# Patient Record
Sex: Female | Born: 1980 | Race: Black or African American | Hispanic: No | Marital: Single | Smoking: Never smoker
Health system: Southern US, Community
[De-identification: ages and names within clinical notes are randomized; demographics above are authoritative.]

## PROBLEM LIST (undated history)

## (undated) DIAGNOSIS — R51 Headache: Secondary | ICD-10-CM

## (undated) DIAGNOSIS — K625 Hemorrhage of anus and rectum: Secondary | ICD-10-CM

## (undated) DIAGNOSIS — T7840XA Allergy, unspecified, initial encounter: Secondary | ICD-10-CM

## (undated) HISTORY — DX: Headache: R51

## (undated) HISTORY — DX: Hemorrhage of anus and rectum: K62.5

## (undated) HISTORY — DX: Allergy, unspecified, initial encounter: T78.40XA

## (undated) HISTORY — PX: COSMETIC SURGERY: SHX468

---

## 2003-11-28 ENCOUNTER — Emergency Department (HOSPITAL_COMMUNITY): Admission: EM | Admit: 2003-11-28 | Discharge: 2003-11-29 | Payer: Self-pay | Admitting: Emergency Medicine

## 2004-05-20 ENCOUNTER — Emergency Department (HOSPITAL_COMMUNITY): Admission: EM | Admit: 2004-05-20 | Discharge: 2004-05-20 | Payer: Self-pay | Admitting: Emergency Medicine

## 2006-08-20 ENCOUNTER — Emergency Department (HOSPITAL_COMMUNITY): Admission: EM | Admit: 2006-08-20 | Discharge: 2006-08-20 | Payer: Self-pay | Admitting: Emergency Medicine

## 2007-08-15 ENCOUNTER — Other Ambulatory Visit: Admission: RE | Admit: 2007-08-15 | Discharge: 2007-08-15 | Payer: Self-pay | Admitting: Gynecology

## 2007-11-10 ENCOUNTER — Emergency Department (HOSPITAL_COMMUNITY): Admission: EM | Admit: 2007-11-10 | Discharge: 2007-11-10 | Payer: Self-pay | Admitting: Emergency Medicine

## 2011-11-12 ENCOUNTER — Ambulatory Visit (INDEPENDENT_AMBULATORY_CARE_PROVIDER_SITE_OTHER): Payer: Managed Care, Other (non HMO) | Admitting: Physician Assistant

## 2011-11-12 VITALS — BP 114/76 | HR 76 | Temp 98.5°F | Resp 16 | Ht 63.0 in | Wt 188.0 lb

## 2011-11-12 DIAGNOSIS — J309 Allergic rhinitis, unspecified: Secondary | ICD-10-CM

## 2011-11-12 DIAGNOSIS — J019 Acute sinusitis, unspecified: Secondary | ICD-10-CM

## 2011-11-12 MED ORDER — IPRATROPIUM BROMIDE 0.03 % NA SOLN: 2.0000 | Freq: Two times a day (BID) | NASAL | Status: DC

## 2011-11-12 MED ORDER — AMOXICILLIN 875 MG PO TABS: 1750.0000 mg | ORAL_TABLET | Freq: Two times a day (BID) | ORAL | Status: AC

## 2011-11-12 NOTE — Patient Instructions (Signed)
Get lots of rest.  Drink at least 64 ounces of water daily. Continue to use the Mucinex to thin the mucous so that it can more easily drain out!

## 2011-11-12 NOTE — Progress Notes (Signed)
  Subjective:    Patient ID: Natasha Dunn, female    DOB: 26-Feb-1981, 31 y.o.   MRN: [id number]  HPI 1 week of congestion.  Loss of taste and smell.  WAS coughing a little bit. No ST, HA.  No myalgias, arthralgias.  No GU/GI symptoms.   Review of Systems As above.    Objective:   Physical Exam Vital signs noted. Well-developed, well nourished BF who is awake, alert and oriented, in NAD. HEENT: [place]/AT, PERRL, EOMI.  Sclera and conjunctiva are clear.  EAC are patent, TMs are normal in appearance. Nasal mucosa is pink and moist, congested. OP is clear. Tenderness with palpation of the frontal sinuses bilaterally. Neck: supple, non-tender, no lymphadenopathy, thyromegaly. Heart: RRR, no murmur Lungs: CTA Skin: warm and dry without rash.      Assessment & Plan:   1. AR (allergic rhinitis)  ipratropium (ATROVENT) 0.03 % nasal spray  2. Acute sinusitis, unspecified  amoxicillin (AMOXIL) 875 MG tablet   Supportive care. Continue Mucinex.

## 2011-12-07 ENCOUNTER — Ambulatory Visit (INDEPENDENT_AMBULATORY_CARE_PROVIDER_SITE_OTHER): Payer: Managed Care, Other (non HMO) | Admitting: Internal Medicine

## 2011-12-07 VITALS — BP 101/70 | HR 90 | Temp 98.1°F | Resp 16 | Ht 63.0 in | Wt 187.0 lb

## 2011-12-07 DIAGNOSIS — K625 Hemorrhage of anus and rectum: Secondary | ICD-10-CM

## 2011-12-07 DIAGNOSIS — K602 Anal fissure, unspecified: Secondary | ICD-10-CM

## 2011-12-07 DIAGNOSIS — K5900 Constipation, unspecified: Secondary | ICD-10-CM

## 2011-12-07 DIAGNOSIS — K6289 Other specified diseases of anus and rectum: Secondary | ICD-10-CM

## 2011-12-07 NOTE — Progress Notes (Signed)
  Subjective:    Patient ID: Natasha Dunn, female    DOB: [birth year], 31 y.o.   MRN: [id number]  HPI On her menses and refuses exam of her anal area today but agrees to rtc for exam if persists. Has constipation, and occ bleeds on TP after hard stool. Can be painful No fever, abd pain, or bloody diarrhea   Review of Systems     Objective:   Physical Exam Healthy  Refuses anal exam       Assessment & Plan:  Anal fissure likely Diltiazem and lidocain gel tid Constipation care

## 2011-12-07 NOTE — Patient Instructions (Signed)
Anal Fissure, Adult An anal fissure is a small tear or crack in the skin around the anus. Bleeding from a fissure usually stops on its own within a few minutes. However, bleeding will often reoccur with each bowel movement until the crack heals.  CAUSES   Passing large, hard stools.   Frequent diarrheal stools.   Constipation.   Inflammatory bowel disease (Crohn's disease or ulcerative colitis).   Infections.   Anal sex.  SYMPTOMS   Small amounts of blood seen on your stools, on toilet paper, or in the toilet after a bowel movement.   Rectal bleeding.   Painful bowel movements.   Itching or irritation around the anus.  DIAGNOSIS Your caregiver will examine the anal area. An anal fissure can usually be seen with careful inspection. A rectal exam may be performed and a short tube (anoscope) may be used to examine the anal canal. TREATMENT   You may be instructed to take fiber supplements. These supplements can soften your stool to help make bowel movements easier.   Sitz baths may be recommended to help heal the tear. Do not use soap in the sitz baths.   A medicated cream or ointment may be prescribed to lessen discomfort.  HOME CARE INSTRUCTIONS   Maintain a diet high in fruits, whole grains, and vegetables. Avoid constipating foods like bananas and dairy products.   Take sitz baths as directed by your caregiver.   Drink enough fluids to keep your urine clear or pale yellow.   Only take over-the-counter or prescription medicines for pain, discomfort, or fever as directed by your caregiver. Do not take aspirin as this may increase bleeding.   Do not use ointments containing numbing medications (anesthetics) or hydrocortisone. They could slow healing.  SEEK MEDICAL CARE IF:   Your fissure is not completely healed within 3 days.   You have further bleeding.   You have a fever.   You have diarrhea mixed with blood.   You have pain.   Your problem is getting worse  rather than better.  MAKE SURE YOU:   Understand these instructions.   Will watch your condition.   Will get help right away if you are not doing well or get worse.  Document Released: 07/10/2005 Document Revised: 06/29/2011 Document Reviewed: 12/25/2010 ExitCare Patient Information 2012 [employer]. 

## 2012-04-16 ENCOUNTER — Ambulatory Visit (INDEPENDENT_AMBULATORY_CARE_PROVIDER_SITE_OTHER): Payer: Managed Care, Other (non HMO) | Admitting: Emergency Medicine

## 2012-04-16 VITALS — BP 118/76 | HR 80 | Temp 98.1°F | Resp 16 | Ht 64.0 in | Wt 182.2 lb

## 2012-04-16 DIAGNOSIS — K625 Hemorrhage of anus and rectum: Secondary | ICD-10-CM

## 2012-04-16 DIAGNOSIS — K5900 Constipation, unspecified: Secondary | ICD-10-CM

## 2012-04-16 MED ORDER — POLYETHYLENE GLYCOL 3350 17 GM/SCOOP PO POWD: 17.0000 g | Freq: Two times a day (BID) | ORAL | Status: DC | PRN

## 2012-04-16 NOTE — Progress Notes (Deleted)
   Date:  04/16/2012   Name:  Natasha Dunn   DOB:  10-09-1980   MRN:  [id number] Gender: female Age: 31 y.o.  PCP:  No primary provider on file.    Chief Complaint: Hemorrhoids   History of Present Illness:  Natasha Dunn is a 31 y.o. pleasant patient who presents with the following:  ***  There is no problem list on file for this patient.   Past Medical History  Diagnosis Date  . Allergy     No past surgical history on file.  History  Substance Use Topics  . Smoking status: Never Smoker   . Smokeless tobacco: Not on file  . Alcohol Use: Yes    Family History  Problem Relation Age of Onset  . Hypertension Mother   . Diabetes Sister     No Known Allergies  Medication list has been reviewed and updated.  Outpatient Prescriptions Prior to Visit  Medication Sig Dispense Refill  . ipratropium (ATROVENT) 0.03 % nasal spray Place 2 sprays into the nose 2 (two) times daily.  30 mL  0    Review of Systems:  ***  Physical Examination: Filed Vitals:   04/16/12 1557  BP: 118/76  Pulse: 80  Temp: 98.1 F (36.7 C)  Resp: 16   Filed Vitals:   04/16/12 1557  Height: 5\' 4"  (1.626 m)  Weight: 182 lb 3.2 oz (82.645 kg)   Body mass index is 31.27 kg/(m^2). Ideal Body Weight: Weight in (lb) to have BMI = 25: 145.3   ***  Assessment and Plan: ***  Carmelina Dane, MD

## 2012-04-16 NOTE — Progress Notes (Signed)
   Date:  04/16/2012   Name:  Natasha Dunn   DOB:  1981-02-14   MRN:  [id number] Gender: female Age: 31 y.o.  PCP:  No primary provider on file.    Chief Complaint: Hemorrhoids   History of Present Illness:  Natasha Dunn is a 31 y.o. pleasant patient who presents with the following:  Gives a history of pain with defecation, frequent blood coating stools, and a palpable irreducible mass in anus.  Has experienced this problem for at least 9 months.  Has not been compliant with dietary suggestions.  Never evaluated.    There is no problem list on file for this patient.   Past Medical History  Diagnosis Date  . Allergy     No past surgical history on file.  History  Substance Use Topics  . Smoking status: Never Smoker   . Smokeless tobacco: Not on file  . Alcohol Use: Yes    Family History  Problem Relation Age of Onset  . Hypertension Mother   . Diabetes Sister     No Known Allergies  Medication list has been reviewed and updated.  Outpatient Prescriptions Prior to Visit  Medication Sig Dispense Refill  . ipratropium (ATROVENT) 0.03 % nasal spray Place 2 sprays into the nose 2 (two) times daily.  30 mL  0    Review of Systems:  As per HPI, otherwise negative.    Physical Examination: Filed Vitals:   04/16/12 1557  BP: 118/76  Pulse: 80  Temp: 98.1 F (36.7 C)  Resp: 16   Filed Vitals:   04/16/12 1557  Height: 5\' 4"  (1.626 m)  Weight: 182 lb 3.2 oz (82.645 kg)   Body mass index is 31.27 kg/(m^2). Ideal Body Weight: Weight in (lb) to have BMI = 25: 145.3    GEN: WDWN, NAD, Non-toxic, Alert & Oriented x 3 HEENT: Atraumatic, Normocephalic.  Ears and Nose: No external deformity. EXTR: No clubbing/cyanosis/edema NEURO: Normal gait.  PSYCH: Normally interactive. Conversant. Not depressed or anxious appearing.  Calm demeanor.  RECTAL:  No external mass or bleeding.   Assessment and Plan: Constipation Rectal bleeding GI consult for  colonoscopy miralax   Carmelina Dane, MD I have reviewed and agree with documentation. Robert P. Merla Riches, M.D.

## 2012-04-18 DIAGNOSIS — K625 Hemorrhage of anus and rectum: Secondary | ICD-10-CM

## 2012-04-18 HISTORY — DX: Hemorrhage of anus and rectum: K62.5

## 2012-05-07 ENCOUNTER — Encounter: Payer: Self-pay | Admitting: Family Medicine

## 2012-10-08 ENCOUNTER — Ambulatory Visit (INDEPENDENT_AMBULATORY_CARE_PROVIDER_SITE_OTHER): Payer: Managed Care, Other (non HMO) | Admitting: Emergency Medicine

## 2012-10-08 ENCOUNTER — Ambulatory Visit: Payer: Managed Care, Other (non HMO)

## 2012-10-08 VITALS — BP 133/84 | HR 75 | Temp 97.7°F | Resp 18 | Ht 63.0 in | Wt 183.0 lb

## 2012-10-08 DIAGNOSIS — R079 Chest pain, unspecified: Secondary | ICD-10-CM

## 2012-10-08 DIAGNOSIS — M940 Chondrocostal junction syndrome [Tietze]: Secondary | ICD-10-CM

## 2012-10-08 MED ORDER — NAPROXEN SODIUM 550 MG PO TABS: 550.0000 mg | ORAL_TABLET | Freq: Two times a day (BID) | ORAL | Status: DC

## 2012-10-08 NOTE — Patient Instructions (Addendum)
Costochondritis Costochondritis (Tietze syndrome), or costochondral separation, is a swelling and irritation (inflammation) of the tissue (cartilage) that connects your ribs with your breastbone (sternum). It may occur on its own (spontaneously), through damage caused by an accident (trauma), or simply from coughing or minor exercise. It may take up to 6 weeks to get better and longer if you are unable to be conservative in your activities. HOME CARE INSTRUCTIONS   Avoid exhausting physical activity. Try not to strain your ribs during normal activity. This would include any activities using chest, belly (abdominal), and side muscles, especially if heavy weights are used.  Use ice for 15 to 20 minutes per hour while awake for the first 2 days. Place the ice in a plastic bag, and place a towel between the bag of ice and your skin.  Only take over-the-counter or prescription medicines for pain, discomfort, or fever as directed by your caregiver. SEEK IMMEDIATE MEDICAL CARE IF:   Your pain increases or you are very uncomfortable.  You have a fever.  You develop difficulty with your breathing.  You cough up blood.  You develop worse chest pains, shortness of breath, sweating, or vomiting.  You develop new, unexplained problems (symptoms). MAKE SURE YOU:   Understand these instructions.  Will watch your condition.  Will get help right away if you are not doing well or get worse. Document Released: 04/19/2005 Document Revised: 10/02/2011 Document Reviewed: 02/26/2008 ExitCare Patient Information 2013 [employer].  

## 2012-10-08 NOTE — Progress Notes (Signed)
[facility] [address] [place] [address] [phone]  Date:  10/08/2012   Name:  Natasha Dunn   DOB:  June 12, 1981   MRN:  [id number]  PCP:  No primary provider on file.    Chief Complaint: Chest Pain   History of Present Illness:  Natasha Dunn is a 32 y.o. very pleasant female patient who presents with the following:  Has intermittent sharp left upper chest wall pain that started three days ago.  Non radiating. No history of trauma or overuse.  No cough or hemoptysis.  No shortness of breath or wheezing.  No nausea or breath.  Pain lasts 20 minutes at a time and goes away spontaneously.  No provocative factors.  Pain not radiating.  No risk factors for PE/DVT.  Non smoker.  No OCP.  Works two jobs.  Lost a cousin last week to sudden death.  No depression.  No improvement with over the counter medications or other home remedies. Denies other complaint or health concern today.   There is no problem list on file for this patient.   Past Medical History  Diagnosis Date  . Allergy   . Rectal bleeding 04/18/2012    colonoscopy scheduled for 05/01/12  . Headache     History reviewed. No pertinent past surgical history.  History  Substance Use Topics  . Smoking status: Never Smoker   . Smokeless tobacco: Not on file  . Alcohol Use: Yes     Comment: social    Family History  Problem Relation Age of Onset  . Hypertension Mother   . Diabetes Sister   . Hypertension Sister   . Cancer Maternal Grandmother   . Hypertension Maternal Grandmother   . Diabetes Maternal Grandfather   . Hypertension Maternal Grandfather     No Known Allergies  Medication list has been reviewed and updated.  Current Outpatient Prescriptions on File Prior to Visit  Medication Sig Dispense Refill  . ipratropium (ATROVENT) 0.03 % nasal spray Place 2 sprays into the nose 2 (two) times daily.  30 mL  0  . polyethylene glycol powder (GLYCOLAX/MIRALAX) powder Take 17 g by  mouth 2 (two) times daily as needed.  3350 g  1   No current facility-administered medications on file prior to visit.    Review of Systems:  As per HPI, otherwise negative.    Physical Examination: Filed Vitals:   10/08/12 1846  BP: 133/84  Pulse: 75  Temp: 97.7 F (36.5 C)  Resp: 18   Filed Vitals:   10/08/12 1846  Height: 5\' 3"  (1.6 m)  Weight: 183 lb (83.008 kg)   Body mass index is 32.43 kg/(m^2). Ideal Body Weight: Weight in (lb) to have BMI = 25: 140.8  GEN: WDWN, NAD, Non-toxic, A & O x 3 HEENT: Atraumatic, Normocephalic. Neck supple. No masses, No LAD. Ears and Nose: No external deformity. CV: RRR, No M/G/R. No JVD. No thrill. No extra heart sounds.  No chest wall tenderness. No crepitus or rub PULM: CTA B, no wheezes, crackles, rhonchi. No retractions. No resp. distress. No accessory muscle use. ABD: S, NT, ND, +BS. No rebound. No HSM. EXTR: No c/c/e NEURO Normal gait.  PSYCH: Normally interactive. Conversant. Not depressed or anxious appearing.  Calm demeanor.    Assessment and Plan: Chest pain CXR    Signed,  Phillips Odor, MD   [facility] reading (PRIMARY) by  Dr. Dareen Piano.  Negative chest.

## 2013-01-20 ENCOUNTER — Ambulatory Visit (INDEPENDENT_AMBULATORY_CARE_PROVIDER_SITE_OTHER): Payer: Managed Care, Other (non HMO) | Admitting: Emergency Medicine

## 2013-01-20 VITALS — BP 123/80 | HR 54 | Temp 98.3°F | Resp 18 | Ht 63.5 in | Wt 188.8 lb

## 2013-01-20 DIAGNOSIS — N644 Mastodynia: Secondary | ICD-10-CM

## 2013-01-20 NOTE — Progress Notes (Signed)
[facility] [address], [place] [address] [phone]  Date:  01/20/2013   Name:  Natasha Dunn   DOB:  09/20/1980   MRN:  [id number]  PCP:  No primary provider on file.    Chief Complaint: Breast Pain   History of Present Illness:  Natasha Dunn is a 32 y.o. very pleasant female patient who presents with the following:  Seen in March with costochondritis.  Her aunt was just diagnosed with breast cancer.  She has noticed pain in her left breast in the absence of masses, drainage, bleeding, or discharge.  She is concerned that she may have a breast cancer.  She has never had a mammogram.  No improvement with over the counter medications or other home remedies. Denies other complaint or health concern today.   There are no active problems to display for this patient.   Past Medical History  Diagnosis Date  . Allergy   . Rectal bleeding 04/18/2012    colonoscopy scheduled for 05/01/12  . GEXBMWUX(324.4)     Past Surgical History  Procedure Laterality Date  . Cosmetic surgery      History  Substance Use Topics  . Smoking status: Never Smoker   . Smokeless tobacco: Not on file  . Alcohol Use: Yes     Comment: social    Family History  Problem Relation Age of Onset  . Hypertension Mother   . Diabetes Sister   . Hypertension Sister   . Cancer Maternal Grandmother   . Hypertension Maternal Grandmother   . Diabetes Maternal Grandfather   . Hypertension Maternal Grandfather     No Known Allergies  Medication list has been reviewed and updated.  Current Outpatient Prescriptions on File Prior to Visit  Medication Sig Dispense Refill  . ipratropium (ATROVENT) 0.03 % nasal spray Place 2 sprays into the nose 2 (two) times daily.  30 mL  0  . naproxen sodium (ANAPROX DS) 550 MG tablet Take 1 tablet (550 mg total) by mouth 2 (two) times daily with a meal.  40 tablet  0  . polyethylene glycol powder (GLYCOLAX/MIRALAX) powder Take 17 g by mouth 2  (two) times daily as needed.  3350 g  1   No current facility-administered medications on file prior to visit.    Review of Systems:  As per HPI, otherwise negative.    Physical Examination: Filed Vitals:   01/20/13 1543  BP: 123/80  Pulse: 54  Temp: 98.3 F (36.8 C)  Resp: 18   Filed Vitals:   01/20/13 1543  Height: 5' 3.5" (1.613 m)  Weight: 188 lb 12.8 oz (85.639 kg)   Body mass index is 32.92 kg/(m^2). Ideal Body Weight: Weight in (lb) to have BMI = 25: 143.1  GEN: WDWN, NAD, Non-toxic, A & O x 3 HEENT: Atraumatic, Normocephalic. Neck supple. No masses, No LAD. Ears and Nose: No external deformity. CV: RRR, No M/G/R. No JVD. No thrill. No extra heart sounds. PULM: CTA B, no wheezes, crackles, rhonchi. No retractions. No resp. distress. No accessory muscle use. ABD: S, NT, ND, +BS. No rebound. No HSM. EXTR: No c/c/e NEURO Normal gait.  PSYCH: Normally interactive. Conversant. Not depressed or anxious appearing.  Calm demeanor.  BREASTS:  Normal bilaterally no masses  Assessment and Plan: Breast pain Mammogram   Signed,  Phillips Odor, MD

## 2013-01-20 NOTE — Patient Instructions (Addendum)

## 2013-01-22 NOTE — Addendum Note (Signed)
Addended by: Carmelina Dane on: 01/22/2013 02:26 PM   Modules accepted: Orders

## 2013-06-29 ENCOUNTER — Ambulatory Visit (INDEPENDENT_AMBULATORY_CARE_PROVIDER_SITE_OTHER): Payer: BC Managed Care – PPO | Admitting: Family Medicine

## 2013-06-29 VITALS — BP 122/78 | HR 80 | Temp 98.1°F | Resp 16 | Ht 63.25 in | Wt 199.0 lb

## 2013-06-29 DIAGNOSIS — H00016 Hordeolum externum left eye, unspecified eyelid: Secondary | ICD-10-CM

## 2013-06-29 DIAGNOSIS — H00019 Hordeolum externum unspecified eye, unspecified eyelid: Secondary | ICD-10-CM

## 2013-06-29 MED ORDER — TOBRAMYCIN 0.3 % OP SOLN: 1.0000 [drp] | Freq: Four times a day (QID) | OPHTHALMIC | Status: AC

## 2013-06-29 MED ORDER — DOXYCYCLINE HYCLATE 100 MG PO TABS: 100.0000 mg | ORAL_TABLET | Freq: Two times a day (BID) | ORAL | Status: DC

## 2013-06-29 NOTE — Progress Notes (Addendum)
° °  Subjective:  This chart was scribed for Elvina Sidle, MD by Carl Best, Medical Scribe. This patient was seen in Room 3 and the patient's care was started at 11:34 AM.   Patient ID: Natasha Dunn, female    DOB: [birth year], 32 y.o.   MRN: [id number]  HPI HPI Comments: Natasha Dunn is a 32 y.o. female who [facility] the [facility] complaining of upper left eyelid swelling that started two days ago.  The patient states that when she woke up this morning her left eyelid was crusty.  She lists left eyelid pain as an associated symptom.  The patient states that she applied a warm compress to her left eye.  The patient works in Clinical biochemist for [employer].  Past Medical History  Diagnosis Date   Allergy    Rectal bleeding 04/18/2012    colonoscopy scheduled for 05/01/12   Headache(784.0)    Past Surgical History  Procedure Laterality Date   Cosmetic surgery     Family History  Problem Relation Age of Onset   Hypertension Mother    Diabetes Sister    Hypertension Sister    Cancer Maternal Grandmother    Hypertension Maternal Grandmother    Diabetes Maternal Grandfather    Hypertension Maternal Grandfather    History   Social History   Marital Status: Single    Spouse Name: N/A    Number of Children: N/A   Years of Education: N/A   Occupational History   Not on file.   Social History Main Topics   Smoking status: Never Smoker    Smokeless tobacco: Not on file   Alcohol Use: Yes     Comment: social   Drug Use: No   Sexual Activity: Not on file   Other Topics Concern   Not on file   Social History Narrative   No narrative on file   No Known Allergies   Review of Systems  Eyes: Positive for pain (left eye).  All other systems reviewed and are negative.     Objective:  Physical Exam No acute distress Left eyes shows mild swelling in the mid upper lid with erythema Ophthalmoscopic exam is  normal     Assessment & Plan:   I personally performed the services described in this documentation, which was scribed in my presence. The recorded information has been reviewed and is accurate.  Stye, left - Plan: tobramycin (TOBREX) 0.3 % ophthalmic solution, doxycycline (VIBRA-TABS) 100 MG tablet Hot compresses, temporarily d/c eye liner Signed, Elvina Sidle, MD

## 2013-09-22 ENCOUNTER — Ambulatory Visit (INDEPENDENT_AMBULATORY_CARE_PROVIDER_SITE_OTHER): Payer: BC Managed Care – PPO | Admitting: Physician Assistant

## 2013-09-22 VITALS — BP 120/86 | HR 84 | Temp 98.2°F | Resp 18 | Ht 63.0 in | Wt 183.8 lb

## 2013-09-22 DIAGNOSIS — H6980 Other specified disorders of Eustachian tube, unspecified ear: Secondary | ICD-10-CM

## 2013-09-22 DIAGNOSIS — H6590 Unspecified nonsuppurative otitis media, unspecified ear: Secondary | ICD-10-CM

## 2013-09-22 MED ORDER — FLUTICASONE PROPIONATE 50 MCG/ACT NA SUSP: 2.0000 | Freq: Every day | NASAL | Status: AC

## 2013-09-22 NOTE — Patient Instructions (Signed)
There is no evidence of infection in your ear today.  Use the Flonase twice daily for 1 week, then decrease to once daily  Use ibuprofen 600mg  every 8 hours with food if needed for pain  Please let us know if any symptoms are worsening or not improving

## 2013-09-22 NOTE — Progress Notes (Signed)
   Subjective:    Patient ID: Natasha Dunn, female    DOB: 09/04/1980, 33 y.o.   MRN: [id number]  HPI   Natasha Dunn is a very pleasant 33 yr old female here with concern for possible ear infection.  The Left ear began hurting today.  She is able to hear out of the ear.  She denies drainage from the ear.  No known FB to the ear.  She denies URI symptoms.  Right ear hurts slightly, but left is worse.  No fever.  No sick contacts.  Frequent ear infections as a child.  Has not tried anything for symptoms   Review of Systems  Constitutional: Negative for fever and chills.  HENT: Positive for ear pain. Negative for congestion, ear discharge, rhinorrhea, sneezing and sore throat.   Respiratory: Negative for cough, shortness of breath and wheezing.   Cardiovascular: Negative.   Gastrointestinal: Negative.   Musculoskeletal: Negative.   Skin: Negative.        Objective:   Physical Exam  Vitals reviewed. Constitutional: She is oriented to person, place, and time. She appears well-developed and well-nourished. No distress.  HENT:  Head: Normocephalic and atraumatic.  Right Ear: Tympanic membrane and ear canal normal.  Left Ear: Ear canal normal. A middle ear effusion is present.  Mouth/Throat: Uvula is midline, oropharynx is clear and moist and mucous membranes are normal.  Eyes: Conjunctivae are normal. No scleral icterus.  Neck: Neck supple.  Cardiovascular: Normal rate, regular rhythm and normal heart sounds.   Pulmonary/Chest: Effort normal and breath sounds normal. She has no wheezes. She has no rales.  Neurological: She is alert and oriented to person, place, and time.  Skin: Skin is warm and dry.  Psychiatric: She has a normal mood and affect. Her behavior is normal.        Assessment & Plan:  Middle ear effusion - Plan: fluticasone (FLONASE) 50 MCG/ACT nasal spray  ETD (eustachian tube dysfunction) - Plan: fluticasone (FLONASE) 50 MCG/ACT nasal spray   Natasha Dunn is a very  pleasant 33 yr old female here with 1 day of Left ear pain.  On exam, there is no evidence of infection, but there is a mid ear effusion on the left.  Will treat with Flonase.  I placed Auralgan drops in her ear prior to leaving to help with pain relief.  Can use Advil, Tylenol if needed.   Pt to call or RTC if worsening or not improving  E. Frances FurbishElizabeth Anikah Hogge MHS, PA-C [facility] 3/3/20155:08 PM

## 2013-12-10 ENCOUNTER — Other Ambulatory Visit (HOSPITAL_COMMUNITY)
Admission: RE | Admit: 2013-12-10 | Discharge: 2013-12-10 | Disposition: A | Discharge status: Home / Self Care | Payer: BC Managed Care – PPO | Source: Ambulatory Visit | Attending: Family Medicine | Admitting: Family Medicine

## 2013-12-10 ENCOUNTER — Other Ambulatory Visit: Payer: Self-pay | Admitting: Family Medicine

## 2013-12-10 DIAGNOSIS — Z124 Encounter for screening for malignant neoplasm of cervix: Secondary | ICD-10-CM | POA: Insufficient documentation

## 2013-12-10 DIAGNOSIS — Z1151 Encounter for screening for human papillomavirus (HPV): Secondary | ICD-10-CM | POA: Insufficient documentation

## 2016-05-03 DIAGNOSIS — Z0000 Encounter for general adult medical examination without abnormal findings: Secondary | ICD-10-CM | POA: Diagnosis not present

## 2016-05-03 DIAGNOSIS — E669 Obesity, unspecified: Secondary | ICD-10-CM | POA: Diagnosis not present

## 2016-05-03 DIAGNOSIS — Z113 Encounter for screening for infections with a predominantly sexual mode of transmission: Secondary | ICD-10-CM | POA: Diagnosis not present

## 2016-08-22 DIAGNOSIS — R102 Pelvic and perineal pain: Secondary | ICD-10-CM | POA: Diagnosis not present

## 2016-08-22 DIAGNOSIS — R1033 Periumbilical pain: Secondary | ICD-10-CM | POA: Diagnosis not present

## 2016-08-25 ENCOUNTER — Other Ambulatory Visit: Payer: Self-pay | Admitting: Family Medicine

## 2016-08-25 DIAGNOSIS — R1033 Periumbilical pain: Secondary | ICD-10-CM

## 2016-08-25 DIAGNOSIS — R102 Pelvic and perineal pain: Secondary | ICD-10-CM

## 2016-09-01 DIAGNOSIS — Z713 Dietary counseling and surveillance: Secondary | ICD-10-CM | POA: Diagnosis not present

## 2016-11-09 DIAGNOSIS — J069 Acute upper respiratory infection, unspecified: Secondary | ICD-10-CM | POA: Diagnosis not present

## 2016-12-02 DIAGNOSIS — N390 Urinary tract infection, site not specified: Secondary | ICD-10-CM | POA: Diagnosis not present

## 2016-12-12 DIAGNOSIS — R350 Frequency of micturition: Secondary | ICD-10-CM | POA: Diagnosis not present

## 2017-04-24 DIAGNOSIS — M2142 Flat foot [pes planus] (acquired), left foot: Secondary | ICD-10-CM | POA: Diagnosis not present

## 2017-04-24 DIAGNOSIS — M2141 Flat foot [pes planus] (acquired), right foot: Secondary | ICD-10-CM | POA: Diagnosis not present

## 2017-04-24 DIAGNOSIS — M76822 Posterior tibial tendinitis, left leg: Secondary | ICD-10-CM | POA: Diagnosis not present

## 2017-05-08 ENCOUNTER — Other Ambulatory Visit (HOSPITAL_COMMUNITY)
Admission: RE | Admit: 2017-05-08 | Discharge: 2017-05-08 | Disposition: A | Discharge status: Home / Self Care | Payer: BLUE CROSS/BLUE SHIELD | Source: Ambulatory Visit | Attending: Family Medicine | Admitting: Family Medicine

## 2017-05-08 ENCOUNTER — Other Ambulatory Visit: Payer: Self-pay | Admitting: Family Medicine

## 2017-05-08 DIAGNOSIS — Z0000 Encounter for general adult medical examination without abnormal findings: Secondary | ICD-10-CM | POA: Diagnosis not present

## 2017-05-08 DIAGNOSIS — Z124 Encounter for screening for malignant neoplasm of cervix: Secondary | ICD-10-CM | POA: Diagnosis not present

## 2017-05-08 DIAGNOSIS — Z01411 Encounter for gynecological examination (general) (routine) with abnormal findings: Secondary | ICD-10-CM | POA: Diagnosis not present

## 2017-05-08 DIAGNOSIS — Z6832 Body mass index (BMI) 32.0-32.9, adult: Secondary | ICD-10-CM | POA: Diagnosis not present

## 2017-05-10 LAB — CYTOLOGY - PAP
Adequacy: ABSENT
Diagnosis: NEGATIVE
HPV: NOT DETECTED

## 2017-10-31 DIAGNOSIS — J3089 Other allergic rhinitis: Secondary | ICD-10-CM | POA: Diagnosis not present

## 2018-01-28 DIAGNOSIS — J069 Acute upper respiratory infection, unspecified: Secondary | ICD-10-CM | POA: Diagnosis not present

## 2018-02-06 DIAGNOSIS — H1030 Unspecified acute conjunctivitis, unspecified eye: Secondary | ICD-10-CM | POA: Diagnosis not present

## 2018-03-26 DIAGNOSIS — S99919A Unspecified injury of unspecified ankle, initial encounter: Secondary | ICD-10-CM | POA: Diagnosis not present

## 2018-03-27 ENCOUNTER — Ambulatory Visit
Admission: RE | Admit: 2018-03-27 | Discharge: 2018-03-27 | Disposition: A | Discharge status: Home / Self Care | Payer: BLUE CROSS/BLUE SHIELD | Source: Ambulatory Visit | Attending: Family Medicine | Admitting: Family Medicine

## 2018-03-27 ENCOUNTER — Other Ambulatory Visit: Payer: Self-pay | Admitting: Family Medicine

## 2018-03-27 DIAGNOSIS — S99911A Unspecified injury of right ankle, initial encounter: Secondary | ICD-10-CM

## 2018-05-01 DIAGNOSIS — L821 Other seborrheic keratosis: Secondary | ICD-10-CM | POA: Diagnosis not present

## 2018-05-01 DIAGNOSIS — L219 Seborrheic dermatitis, unspecified: Secondary | ICD-10-CM | POA: Diagnosis not present

## 2018-05-29 DIAGNOSIS — Z113 Encounter for screening for infections with a predominantly sexual mode of transmission: Secondary | ICD-10-CM | POA: Diagnosis not present

## 2018-05-29 DIAGNOSIS — Z6833 Body mass index (BMI) 33.0-33.9, adult: Secondary | ICD-10-CM | POA: Diagnosis not present

## 2018-05-29 DIAGNOSIS — Z0000 Encounter for general adult medical examination without abnormal findings: Secondary | ICD-10-CM | POA: Diagnosis not present

## 2018-05-29 DIAGNOSIS — R7303 Prediabetes: Secondary | ICD-10-CM | POA: Diagnosis not present

## 2018-08-28 DIAGNOSIS — R3989 Other symptoms and signs involving the genitourinary system: Secondary | ICD-10-CM | POA: Diagnosis not present

## 2018-08-28 DIAGNOSIS — N3090 Cystitis, unspecified without hematuria: Secondary | ICD-10-CM | POA: Diagnosis not present

## 2018-09-04 DIAGNOSIS — L219 Seborrheic dermatitis, unspecified: Secondary | ICD-10-CM | POA: Diagnosis not present

## 2018-10-17 DIAGNOSIS — F330 Major depressive disorder, recurrent, mild: Secondary | ICD-10-CM | POA: Diagnosis not present

## 2018-10-28 DIAGNOSIS — F330 Major depressive disorder, recurrent, mild: Secondary | ICD-10-CM | POA: Diagnosis not present

## 2018-11-13 DIAGNOSIS — F330 Major depressive disorder, recurrent, mild: Secondary | ICD-10-CM | POA: Diagnosis not present

## 2019-01-29 IMAGING — CR DG ANKLE COMPLETE 3+V*R*
3 series · 3 of 3 positions shown · non-contrast
Comparison: None.

CLINICAL DATA: Right ankle injury.

EXAM:
RIGHT ANKLE - COMPLETE 3+ VIEW

[x ankle ap right]
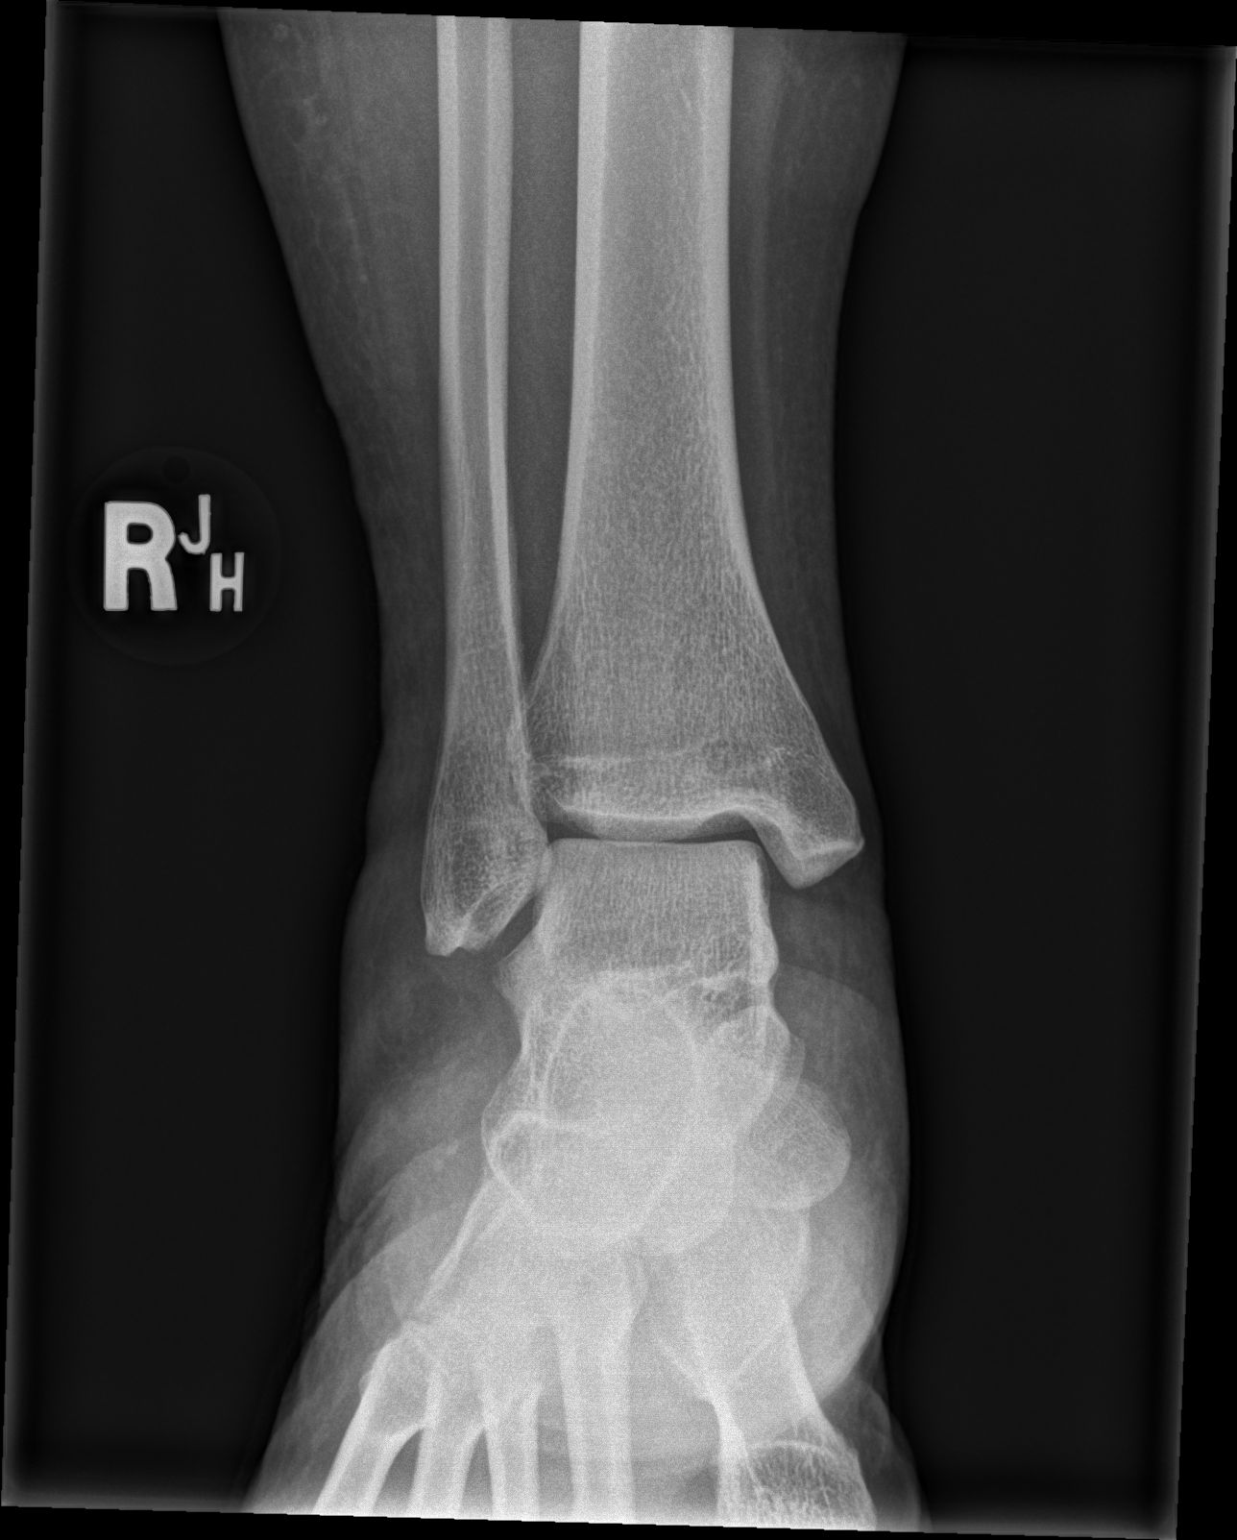

[x ankle obl right]
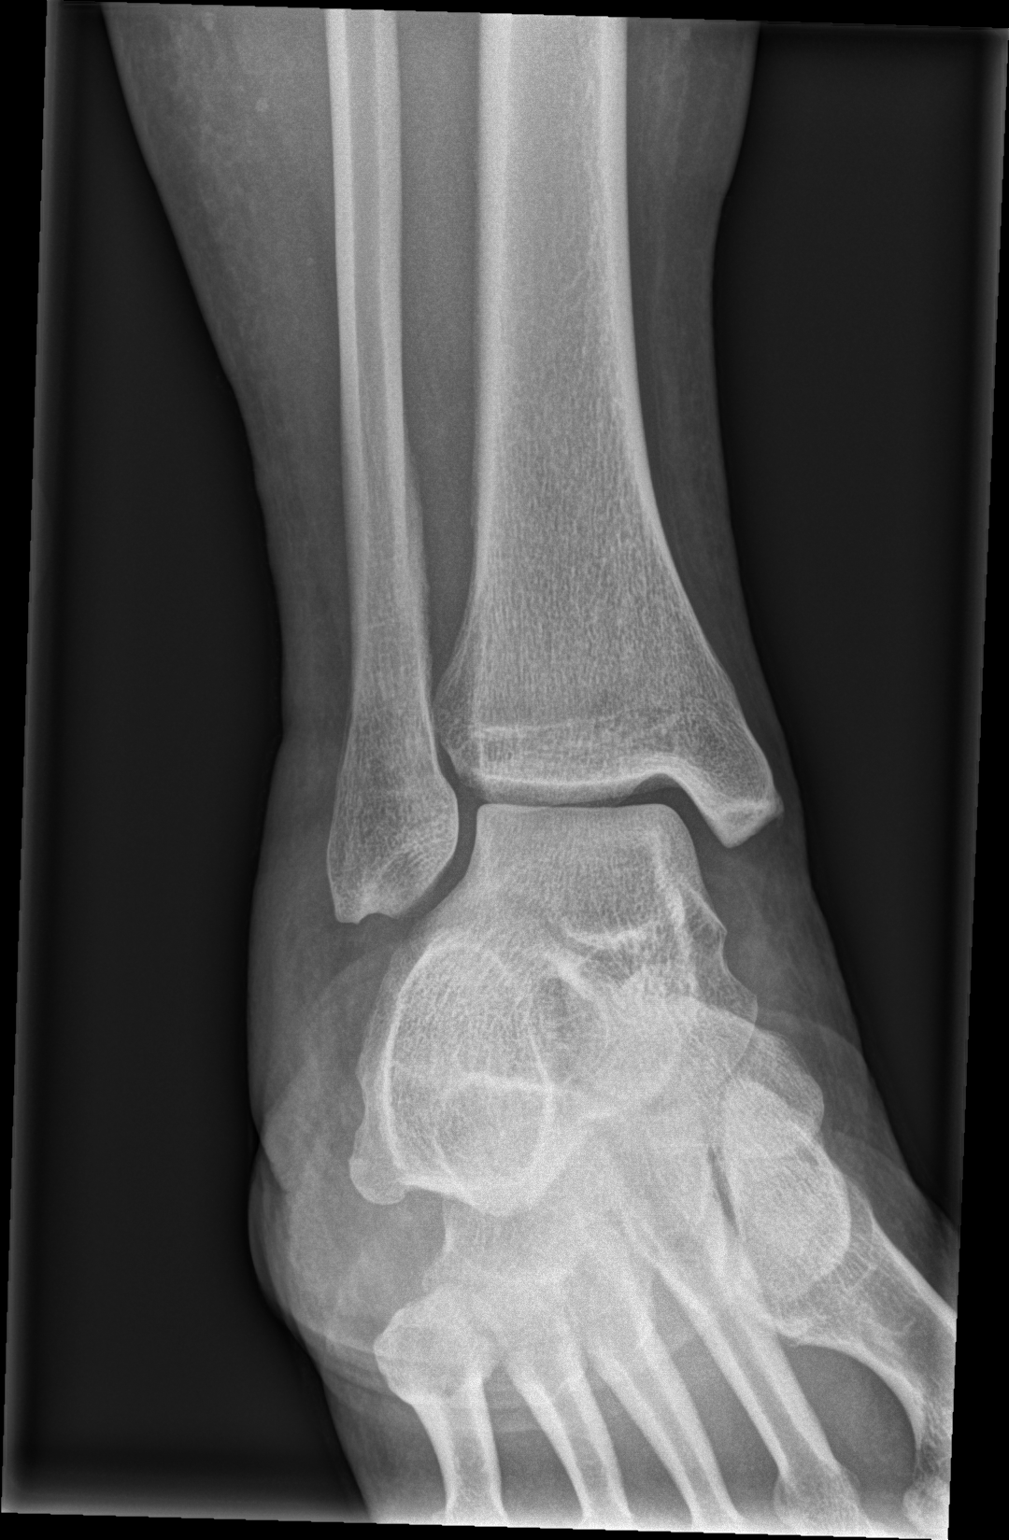

[x ankle lat right]
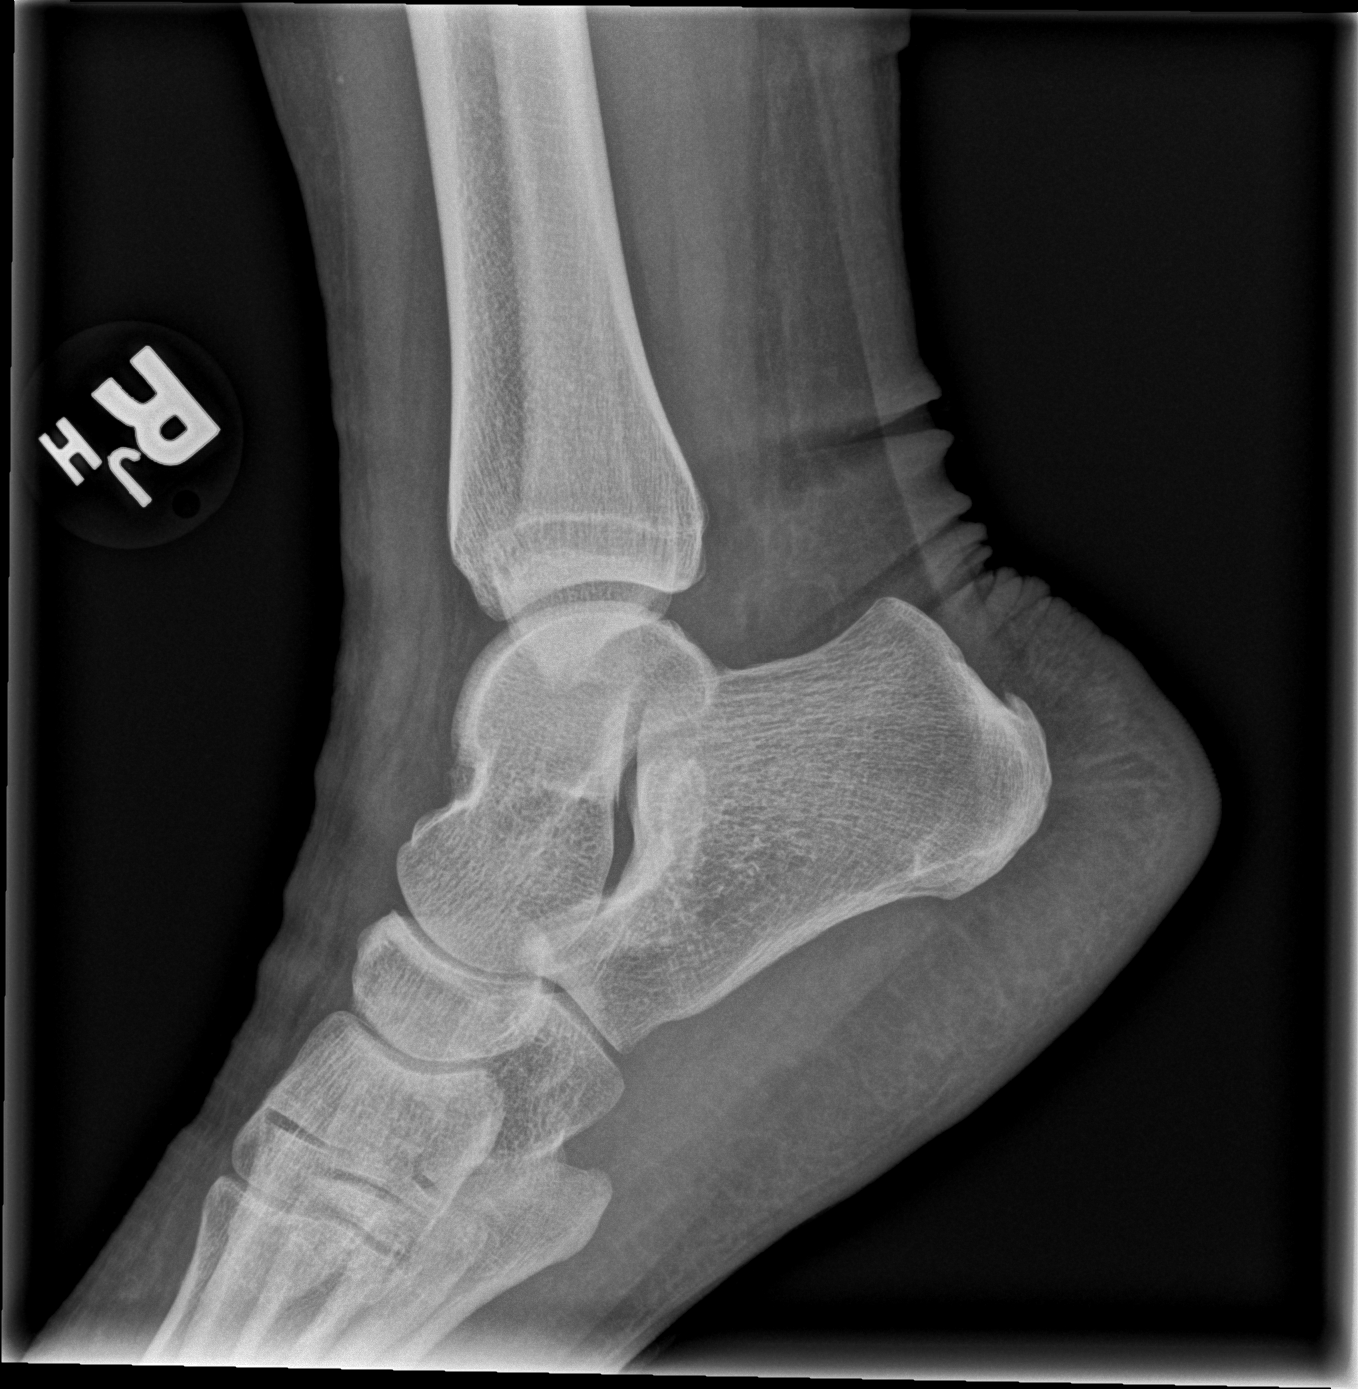

[3 of 3 positions shown; findings below may reference images not displayed]

FINDINGS: There is no evidence of fracture, dislocation, or joint effusion.
There is no evidence of arthropathy or other focal bone abnormality.
Soft tissues are unremarkable.
IMPRESSION: No acute osseous injury of the right ankle.

## 2019-03-05 DIAGNOSIS — L219 Seborrheic dermatitis, unspecified: Secondary | ICD-10-CM | POA: Diagnosis not present

## 2019-03-06 DIAGNOSIS — R350 Frequency of micturition: Secondary | ICD-10-CM | POA: Diagnosis not present

## 2019-03-07 DIAGNOSIS — R350 Frequency of micturition: Secondary | ICD-10-CM | POA: Diagnosis not present

## 2019-03-11 DIAGNOSIS — R350 Frequency of micturition: Secondary | ICD-10-CM | POA: Diagnosis not present

## 2019-03-12 DIAGNOSIS — R350 Frequency of micturition: Secondary | ICD-10-CM | POA: Diagnosis not present

## 2019-03-20 DIAGNOSIS — N3281 Overactive bladder: Secondary | ICD-10-CM | POA: Diagnosis not present

## 2019-03-20 DIAGNOSIS — R350 Frequency of micturition: Secondary | ICD-10-CM | POA: Diagnosis not present

## 2019-04-03 DIAGNOSIS — N3281 Overactive bladder: Secondary | ICD-10-CM | POA: Diagnosis not present

## 2019-04-03 DIAGNOSIS — R350 Frequency of micturition: Secondary | ICD-10-CM | POA: Diagnosis not present

## 2019-05-07 DIAGNOSIS — N3281 Overactive bladder: Secondary | ICD-10-CM | POA: Diagnosis not present

## 2019-05-07 DIAGNOSIS — R350 Frequency of micturition: Secondary | ICD-10-CM | POA: Diagnosis not present

## 2019-06-17 DIAGNOSIS — R350 Frequency of micturition: Secondary | ICD-10-CM | POA: Diagnosis not present

## 2019-06-17 DIAGNOSIS — N3281 Overactive bladder: Secondary | ICD-10-CM | POA: Diagnosis not present

## 2019-06-24 DIAGNOSIS — R350 Frequency of micturition: Secondary | ICD-10-CM | POA: Diagnosis not present

## 2019-07-01 DIAGNOSIS — R350 Frequency of micturition: Secondary | ICD-10-CM | POA: Diagnosis not present

## 2019-07-08 DIAGNOSIS — R350 Frequency of micturition: Secondary | ICD-10-CM | POA: Diagnosis not present

## 2019-08-23 DIAGNOSIS — R519 Headache, unspecified: Secondary | ICD-10-CM | POA: Diagnosis not present

## 2019-08-24 DIAGNOSIS — Z20828 Contact with and (suspected) exposure to other viral communicable diseases: Secondary | ICD-10-CM | POA: Diagnosis not present

## 2019-09-03 DIAGNOSIS — G43009 Migraine without aura, not intractable, without status migrainosus: Secondary | ICD-10-CM | POA: Diagnosis not present

## 2019-09-10 DIAGNOSIS — L219 Seborrheic dermatitis, unspecified: Secondary | ICD-10-CM | POA: Diagnosis not present

## 2019-11-03 DIAGNOSIS — N926 Irregular menstruation, unspecified: Secondary | ICD-10-CM | POA: Diagnosis not present

## 2019-11-03 DIAGNOSIS — J301 Allergic rhinitis due to pollen: Secondary | ICD-10-CM | POA: Diagnosis not present

## 2019-11-04 DIAGNOSIS — O26859 Spotting complicating pregnancy, unspecified trimester: Secondary | ICD-10-CM | POA: Diagnosis not present

## 2019-11-04 DIAGNOSIS — S161XXA Strain of muscle, fascia and tendon at neck level, initial encounter: Secondary | ICD-10-CM | POA: Diagnosis not present

## 2019-11-04 DIAGNOSIS — S39012A Strain of muscle, fascia and tendon of lower back, initial encounter: Secondary | ICD-10-CM | POA: Diagnosis not present

## 2019-11-06 DIAGNOSIS — O26859 Spotting complicating pregnancy, unspecified trimester: Secondary | ICD-10-CM | POA: Diagnosis not present

## 2019-11-06 DIAGNOSIS — O361910 Maternal care for other isoimmunization, first trimester, not applicable or unspecified: Secondary | ICD-10-CM | POA: Diagnosis not present

## 2019-11-06 DIAGNOSIS — O26851 Spotting complicating pregnancy, first trimester: Secondary | ICD-10-CM | POA: Diagnosis not present

## 2019-11-06 DIAGNOSIS — O9A219 Injury, poisoning and certain other consequences of external causes complicating pregnancy, unspecified trimester: Secondary | ICD-10-CM | POA: Diagnosis not present

## 2019-11-06 DIAGNOSIS — Z3A01 Less than 8 weeks gestation of pregnancy: Secondary | ICD-10-CM | POA: Diagnosis not present

## 2019-11-10 DIAGNOSIS — O26851 Spotting complicating pregnancy, first trimester: Secondary | ICD-10-CM | POA: Diagnosis not present

## 2019-11-28 DIAGNOSIS — Z3689 Encounter for other specified antenatal screening: Secondary | ICD-10-CM | POA: Diagnosis not present

## 2019-11-28 DIAGNOSIS — Z3A08 8 weeks gestation of pregnancy: Secondary | ICD-10-CM | POA: Diagnosis not present

## 2019-11-28 DIAGNOSIS — O361910 Maternal care for other isoimmunization, first trimester, not applicable or unspecified: Secondary | ICD-10-CM | POA: Diagnosis not present

## 2019-11-28 DIAGNOSIS — O09521 Supervision of elderly multigravida, first trimester: Secondary | ICD-10-CM | POA: Diagnosis not present

## 2019-11-28 DIAGNOSIS — O3680X0 Pregnancy with inconclusive fetal viability, not applicable or unspecified: Secondary | ICD-10-CM | POA: Diagnosis not present

## 2019-12-25 DIAGNOSIS — Z3689 Encounter for other specified antenatal screening: Secondary | ICD-10-CM | POA: Diagnosis not present

## 2019-12-25 DIAGNOSIS — O361910 Maternal care for other isoimmunization, first trimester, not applicable or unspecified: Secondary | ICD-10-CM | POA: Diagnosis not present

## 2019-12-25 DIAGNOSIS — Z3682 Encounter for antenatal screening for nuchal translucency: Secondary | ICD-10-CM | POA: Diagnosis not present

## 2019-12-25 DIAGNOSIS — Z3A12 12 weeks gestation of pregnancy: Secondary | ICD-10-CM | POA: Diagnosis not present

## 2019-12-25 DIAGNOSIS — O99211 Obesity complicating pregnancy, first trimester: Secondary | ICD-10-CM | POA: Diagnosis not present

## 2019-12-25 DIAGNOSIS — O09511 Supervision of elderly primigravida, first trimester: Secondary | ICD-10-CM | POA: Diagnosis not present

## 2020-01-05 DIAGNOSIS — O09512 Supervision of elderly primigravida, second trimester: Secondary | ICD-10-CM | POA: Diagnosis not present

## 2020-01-05 DIAGNOSIS — O361920 Maternal care for other isoimmunization, second trimester, not applicable or unspecified: Secondary | ICD-10-CM | POA: Diagnosis not present

## 2020-01-05 DIAGNOSIS — O99211 Obesity complicating pregnancy, first trimester: Secondary | ICD-10-CM | POA: Diagnosis not present

## 2020-01-05 DIAGNOSIS — O99212 Obesity complicating pregnancy, second trimester: Secondary | ICD-10-CM | POA: Diagnosis not present

## 2020-01-05 DIAGNOSIS — Z3A14 14 weeks gestation of pregnancy: Secondary | ICD-10-CM | POA: Diagnosis not present

## 2020-01-05 DIAGNOSIS — O361910 Maternal care for other isoimmunization, first trimester, not applicable or unspecified: Secondary | ICD-10-CM | POA: Diagnosis not present

## 2020-01-05 DIAGNOSIS — Z3A13 13 weeks gestation of pregnancy: Secondary | ICD-10-CM | POA: Diagnosis not present

## 2020-01-05 DIAGNOSIS — E669 Obesity, unspecified: Secondary | ICD-10-CM | POA: Diagnosis not present

## 2020-01-05 DIAGNOSIS — O09891 Supervision of other high risk pregnancies, first trimester: Secondary | ICD-10-CM | POA: Diagnosis not present

## 2020-01-05 DIAGNOSIS — O09521 Supervision of elderly multigravida, first trimester: Secondary | ICD-10-CM | POA: Diagnosis not present

## 2020-01-22 DIAGNOSIS — Z3689 Encounter for other specified antenatal screening: Secondary | ICD-10-CM | POA: Diagnosis not present

## 2020-02-19 DIAGNOSIS — Z3689 Encounter for other specified antenatal screening: Secondary | ICD-10-CM | POA: Diagnosis not present

## 2020-02-19 DIAGNOSIS — O09512 Supervision of elderly primigravida, second trimester: Secondary | ICD-10-CM | POA: Diagnosis not present

## 2020-02-19 DIAGNOSIS — O361920 Maternal care for other isoimmunization, second trimester, not applicable or unspecified: Secondary | ICD-10-CM | POA: Diagnosis not present

## 2020-02-19 DIAGNOSIS — Z3A20 20 weeks gestation of pregnancy: Secondary | ICD-10-CM | POA: Diagnosis not present

## 2020-02-19 DIAGNOSIS — Z363 Encounter for antenatal screening for malformations: Secondary | ICD-10-CM | POA: Diagnosis not present

## 2020-03-18 DIAGNOSIS — O09512 Supervision of elderly primigravida, second trimester: Secondary | ICD-10-CM | POA: Diagnosis not present

## 2020-03-18 DIAGNOSIS — Z362 Encounter for other antenatal screening follow-up: Secondary | ICD-10-CM | POA: Diagnosis not present

## 2020-03-18 DIAGNOSIS — O361920 Maternal care for other isoimmunization, second trimester, not applicable or unspecified: Secondary | ICD-10-CM | POA: Diagnosis not present

## 2020-03-18 DIAGNOSIS — Z3A24 24 weeks gestation of pregnancy: Secondary | ICD-10-CM | POA: Diagnosis not present

## 2020-04-15 DIAGNOSIS — O09513 Supervision of elderly primigravida, third trimester: Secondary | ICD-10-CM | POA: Diagnosis not present

## 2020-04-15 DIAGNOSIS — O361930 Maternal care for other isoimmunization, third trimester, not applicable or unspecified: Secondary | ICD-10-CM | POA: Diagnosis not present

## 2020-04-15 DIAGNOSIS — Z3A28 28 weeks gestation of pregnancy: Secondary | ICD-10-CM | POA: Diagnosis not present

## 2020-04-15 DIAGNOSIS — O99213 Obesity complicating pregnancy, third trimester: Secondary | ICD-10-CM | POA: Diagnosis not present

## 2020-04-15 DIAGNOSIS — O361931 Maternal care for other isoimmunization, third trimester, fetus 1: Secondary | ICD-10-CM | POA: Diagnosis not present

## 2020-04-15 DIAGNOSIS — O09523 Supervision of elderly multigravida, third trimester: Secondary | ICD-10-CM | POA: Diagnosis not present

## 2020-05-13 DIAGNOSIS — Z3A32 32 weeks gestation of pregnancy: Secondary | ICD-10-CM | POA: Diagnosis not present

## 2020-05-13 DIAGNOSIS — Z3689 Encounter for other specified antenatal screening: Secondary | ICD-10-CM | POA: Diagnosis not present

## 2020-05-13 DIAGNOSIS — Z23 Encounter for immunization: Secondary | ICD-10-CM | POA: Diagnosis not present

## 2020-05-13 DIAGNOSIS — O09513 Supervision of elderly primigravida, third trimester: Secondary | ICD-10-CM | POA: Diagnosis not present

## 2020-05-13 DIAGNOSIS — R768 Other specified abnormal immunological findings in serum: Secondary | ICD-10-CM | POA: Diagnosis not present

## 2020-05-13 DIAGNOSIS — O361930 Maternal care for other isoimmunization, third trimester, not applicable or unspecified: Secondary | ICD-10-CM | POA: Diagnosis not present

## 2020-05-13 DIAGNOSIS — O99213 Obesity complicating pregnancy, third trimester: Secondary | ICD-10-CM | POA: Diagnosis not present

## 2020-06-04 DIAGNOSIS — O99213 Obesity complicating pregnancy, third trimester: Secondary | ICD-10-CM | POA: Diagnosis not present

## 2020-06-04 DIAGNOSIS — O09523 Supervision of elderly multigravida, third trimester: Secondary | ICD-10-CM | POA: Diagnosis not present

## 2020-06-04 DIAGNOSIS — O361930 Maternal care for other isoimmunization, third trimester, not applicable or unspecified: Secondary | ICD-10-CM | POA: Diagnosis not present

## 2020-06-11 DIAGNOSIS — O09513 Supervision of elderly primigravida, third trimester: Secondary | ICD-10-CM | POA: Diagnosis not present

## 2020-06-11 DIAGNOSIS — O361930 Maternal care for other isoimmunization, third trimester, not applicable or unspecified: Secondary | ICD-10-CM | POA: Diagnosis not present

## 2020-06-11 DIAGNOSIS — Z3A36 36 weeks gestation of pregnancy: Secondary | ICD-10-CM | POA: Diagnosis not present

## 2020-06-18 DIAGNOSIS — Z3A38 38 weeks gestation of pregnancy: Secondary | ICD-10-CM | POA: Diagnosis not present

## 2020-06-18 DIAGNOSIS — O3660X0 Maternal care for excessive fetal growth, unspecified trimester, not applicable or unspecified: Secondary | ICD-10-CM | POA: Diagnosis not present

## 2020-06-18 DIAGNOSIS — O99213 Obesity complicating pregnancy, third trimester: Secondary | ICD-10-CM | POA: Diagnosis not present

## 2020-06-18 DIAGNOSIS — O09523 Supervision of elderly multigravida, third trimester: Secondary | ICD-10-CM | POA: Diagnosis not present

## 2020-06-23 DIAGNOSIS — Z79899 Other long term (current) drug therapy: Secondary | ICD-10-CM | POA: Diagnosis not present

## 2020-06-23 DIAGNOSIS — O163 Unspecified maternal hypertension, third trimester: Secondary | ICD-10-CM | POA: Diagnosis not present

## 2020-06-23 DIAGNOSIS — O3663X0 Maternal care for excessive fetal growth, third trimester, not applicable or unspecified: Secondary | ICD-10-CM | POA: Diagnosis not present

## 2020-06-23 DIAGNOSIS — Z01118 Encounter for examination of ears and hearing with other abnormal findings: Secondary | ICD-10-CM | POA: Diagnosis not present

## 2020-06-23 DIAGNOSIS — O134 Gestational [pregnancy-induced] hypertension without significant proteinuria, complicating childbirth: Secondary | ICD-10-CM | POA: Diagnosis not present

## 2020-06-23 DIAGNOSIS — Z7982 Long term (current) use of aspirin: Secondary | ICD-10-CM | POA: Diagnosis not present

## 2020-06-23 DIAGNOSIS — Z0110 Encounter for examination of ears and hearing without abnormal findings: Secondary | ICD-10-CM | POA: Diagnosis not present

## 2020-06-23 DIAGNOSIS — Z3A38 38 weeks gestation of pregnancy: Secondary | ICD-10-CM | POA: Diagnosis not present

## 2020-06-23 DIAGNOSIS — R94120 Abnormal auditory function study: Secondary | ICD-10-CM | POA: Diagnosis not present

## 2020-06-23 DIAGNOSIS — M5430 Sciatica, unspecified side: Secondary | ICD-10-CM | POA: Diagnosis not present

## 2020-06-23 DIAGNOSIS — Z20822 Contact with and (suspected) exposure to covid-19: Secondary | ICD-10-CM | POA: Diagnosis not present

## 2020-06-23 DIAGNOSIS — O99214 Obesity complicating childbirth: Secondary | ICD-10-CM | POA: Diagnosis not present
